# Patient Record
Sex: Male | Born: 1980 | Race: White | Hispanic: No | Marital: Married | Smoking: Never smoker
Health system: Southern US, Community
[De-identification: ages and names within clinical notes are randomized; demographics above are authoritative.]

## PROBLEM LIST (undated history)

## (undated) DIAGNOSIS — R002 Palpitations: Secondary | ICD-10-CM

## (undated) HISTORY — PX: VASECTOMY: SHX75

## (undated) HISTORY — DX: Palpitations: R00.2

---
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
## 2011-11-26 ENCOUNTER — Ambulatory Visit (INDEPENDENT_AMBULATORY_CARE_PROVIDER_SITE_OTHER): Payer: 59 | Admitting: Sports Medicine

## 2011-11-26 VITALS — BP 124/81

## 2011-11-26 DIAGNOSIS — M7630 Iliotibial band syndrome, unspecified leg: Secondary | ICD-10-CM | POA: Insufficient documentation

## 2011-11-26 DIAGNOSIS — M629 Disorder of muscle, unspecified: Secondary | ICD-10-CM

## 2011-11-26 DIAGNOSIS — M25559 Pain in unspecified hip: Secondary | ICD-10-CM

## 2011-11-26 DIAGNOSIS — M25552 Pain in left hip: Secondary | ICD-10-CM | POA: Insufficient documentation

## 2011-11-26 NOTE — Assessment & Plan Note (Addendum)
Visible swelling on [place] consistent with IT band irritation.  No active bursitis at this time I do not think injection is likely to help

## 2011-11-26 NOTE — Assessment & Plan Note (Addendum)
Will continue HEP, but added more exercises.  Cont cross over step, standing flexion and rotation, and abductor strengthening.  Added step up/step down/step across.  Also patient was show stretches for IT band.  Advised running only distances and speeds that do not cause pain, to ice after running, and use ibuprofen as needed.  Follow up in 4-6 weeks.   We need to steadily increase of weight to his exercises  Use a foam roller to rule out the iliotibial band

## 2011-11-26 NOTE — Progress Notes (Signed)
  Subjective:    Patient ID: Jesse Stephenson, male    DOB: 03-Aug-1980, 31 y.o.   MRN: [id number]  HPI  Jesse Stephenson comes in today with hip pain since April.  He is a runner and has increased his mileage over the past year, and for several months had been running about 30 miles a week.  He was in the middle of a run, when about 7 miles in, he had sudden pain and locking up in his left hip.  He saw Dr. Jerl Santos, who felt this was a greater trochanteric bursitis, and gave him hip strengthening exercises and prescribed an NSAID.  He has been doing the exercises now for 5 weeks, but did not take the NSAID.  He says that for the first two weeks, he only did the exercises, but has been trying to run, and his hip starts hurting 3-4 miles into a run.    He has improve somewhat but would like another opinion to see if he can improve faster or if he needs to change any treatment  Review of Systems See HPI    Objective:   Physical Exam BP 124/81 General appearance: alert, cooperative and no distress Hip: FROM in all fields bilaterally, mild discomfort with internal rotation of left leg.  Strength Strength in all fields is in tact, with left hip abduction slightly weaker than the right.  Pelvic alignment unremarkable to inspection and palpation. Standing hip rotation and gait without trendelenburg / unsteadiness. Left Greater trochanter with tenderness to palpation.  MSK [place] Left Hip:  Hip flexors and rotators in tact without abnormality.  Bony landmarks and joint spaces appear normal There is a hypoechoic stripe along the surface to the IT band. There is a small hypoechoic area over greater trochanter suggestive of a small bursa There is some hypoechoic change around the tendon insertion of the hip rotator muscles Increased Doppler activity seen along the iliotibial band at the greater trochanter       Assessment & Plan:

## 2012-01-07 ENCOUNTER — Ambulatory Visit: Payer: 59 | Admitting: Sports Medicine

## 2013-01-28 ENCOUNTER — Encounter: Payer: Self-pay | Admitting: Gastroenterology

## 2013-03-15 ENCOUNTER — Encounter: Payer: Self-pay | Admitting: Gastroenterology

## 2013-03-15 ENCOUNTER — Ambulatory Visit (INDEPENDENT_AMBULATORY_CARE_PROVIDER_SITE_OTHER): Payer: BC Managed Care – PPO | Admitting: Gastroenterology

## 2013-03-15 VITALS — BP 128/72 | HR 60 | Ht 73.0 in | Wt 195.0 lb

## 2013-03-15 DIAGNOSIS — K921 Melena: Secondary | ICD-10-CM

## 2013-03-15 MED ORDER — SOD PICOSULFATE-MAG OX-CIT ACD 10-3.5-12 MG-GM-GM PO PACK: 1.0000 | PACK | ORAL | Status: DC

## 2013-03-15 NOTE — Progress Notes (Signed)
    History of Present Illness: This is a 32 year old male who relates a 6 month history of intermittent bright red blood per rectum with bowel movements, primarily noted on the tissue paper. He states his paternal grandfather developed colon cancer in his 31s. Recent blood work in Dr. Elmyra Ricks office was unremarkable. Denies weight loss, abdominal pain, constipation, diarrhea, change in stool caliber, melena, nausea, vomiting, dysphagia, reflux symptoms, chest pain.  Review of Systems: Pertinent positive and negative review of systems were noted in the above HPI section. All other review of systems were otherwise negative.  Current Medications, Allergies, Past Medical History, Past Surgical History, Family History and Social History were reviewed in [employer] record.  Physical Exam: General: Well developed , well nourished, no acute distress Head: Normocephalic and atraumatic Eyes:  sclerae anicteric, EOMI Ears: Normal auditory acuity Mouth: No deformity or lesions Neck: Supple, no masses or thyromegaly Lungs: Clear throughout to auscultation Heart: Regular rate and rhythm; no murmurs, rubs or bruits Abdomen: Soft, non tender and non distended. No masses, hepatosplenomegaly or hernias noted. Normal Bowel sounds Rectal: Deferred to colonoscopy  Musculoskeletal: Symmetrical with no gross deformities  Skin: No lesions on visible extremities Pulses:  Normal pulses noted Extremities: No clubbing, cyanosis, edema or deformities noted Neurological: Alert oriented x 4, grossly nonfocal Cervical Nodes:  No significant cervical adenopathy Inguinal Nodes: No significant inguinal adenopathy Psychological:  Alert and cooperative. Normal mood and affect  Assessment and Recommendations:  1. Hematochezia. I suspect a benign source of rectal bleeding such as hemorrhoids. Rule out colorectal neoplasms and other disorders. The risks, benefits, and alternatives to colonoscopy with  possible biopsy and possible polypectomy were discussed with the patient and they consent to proceed.

## 2013-03-15 NOTE — Patient Instructions (Signed)
You have been scheduled for a colonoscopy with propofol. Please follow written instructions given to you at your visit today.  Please pick up your prep kit at the pharmacy within the next 1-3 days. If you use inhalers (even only as needed), please bring them with you on the day of your procedure. Your physician has requested that you go to www.startemmi.com and enter the access code given to you at your visit today. This web site gives a general overview about your procedure. However, you should still follow specific instructions given to you by our office regarding your preparation for the procedure.  Thank you for choosing me and [facility].  Venita Lick. Pleas Koch., MD., Clementeen Graham   cc: Pearson Grippe, MD

## 2013-04-07 ENCOUNTER — Ambulatory Visit (AMBULATORY_SURGERY_CENTER): Payer: BC Managed Care – PPO | Admitting: Gastroenterology

## 2013-04-07 ENCOUNTER — Encounter: Payer: Self-pay | Admitting: Gastroenterology

## 2013-04-07 VITALS — BP 109/53 | HR 50 | Temp 96.4°F | Resp 18 | Ht 73.0 in | Wt 195.0 lb

## 2013-04-07 DIAGNOSIS — K648 Other hemorrhoids: Secondary | ICD-10-CM

## 2013-04-07 DIAGNOSIS — K921 Melena: Secondary | ICD-10-CM

## 2013-04-07 MED ORDER — SODIUM CHLORIDE 0.9 % IV SOLN: 500.0000 mL | INTRAVENOUS | Status: DC

## 2013-04-07 NOTE — Patient Instructions (Signed)

## 2013-04-07 NOTE — Op Note (Signed)
[facility] [address] [place], [address]   COLONOSCOPY PROCEDURE REPORT  PATIENT: Jesse Stephenson, Jesse Stephenson  MR#: [id number] BIRTHDATE: [birth year] , 32  yrs. old GENDER: Male ENDOSCOPIST: Meryl Dare, MD, [facility] REFERRED QM:VHQIO Kim, M.D. PROCEDURE DATE:  04/07/2013 PROCEDURE:   Colonoscopy, diagnostic and Hemorrhoidectomy via sclerosing First Screening Colonoscopy - Avg.  risk and is 50 yrs.  old or older - No.  Prior Negative Screening - Now for repeat screening. N/A  History of Adenoma - Now for follow-up colonoscopy & has been > or = to 3 yrs.  N/A  Polyps Removed Today? No.  Recommend repeat exam, <10 yrs? No. ASA CLASS:   Class I INDICATIONS:hematochezia. MEDICATIONS: MAC sedation, administered by CRNA and propofol (Diprivan) 400mg  IV DESCRIPTION OF PROCEDURE:   After the risks benefits and alternatives of the procedure were thoroughly explained, informed consent was obtained.  A digital rectal exam revealed no abnormalities of the rectum.   The LB [id number]  endoscope was introduced through the anus and advanced to the cecum, which was identified by both the appendix and ileocecal valve. No adverse events experienced.   The quality of the prep was [place] excellent  The instrument was then slowly withdrawn as the colon was fully examined.  COLON FINDINGS: A normal appearing cecum, ileocecal valve, and appendiceal orifice were identified.  The ascending, hepatic flexure, transverse, splenic flexure, descending, sigmoid colon and rectum appeared unremarkable.  No polyps or cancers were seen. Retroflexed views revealed moderate internal hemorrhoids. 1 cc total of 23.4% salien was injected into 2 internal hemorrhoids well above the dentate line. The time to cecum=1 minutes 31 seconds. Withdrawal time=10 minutes 06 seconds.  The scope was withdrawn and the procedure completed.  COMPLICATIONS: There were no complications.  ENDOSCOPIC  IMPRESSION: 1.  Normal colon 2.  Moderate internal hemorrhoids  RECOMMENDATIONS: 1. Continue current colorectal screening recommendations for "routine risk" patients with a repeat colonoscopy at age years. 2. GI follow up as needed  eSigned:  Meryl Dare, MD, [facility] 04/07/2013 8:30 AM

## 2013-04-07 NOTE — Progress Notes (Signed)
Called to room to assist during endoscopic procedure.  Patient ID and intended procedure confirmed with present staff. Received instructions for my participation in the procedure from the performing physician.  
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
## 2013-04-08 ENCOUNTER — Telehealth: Payer: Self-pay | Admitting: *Deleted

## 2013-04-08 NOTE — Telephone Encounter (Signed)
Name identifier, left message, follow-up 

## 2016-05-09 ENCOUNTER — Ambulatory Visit: Payer: Self-pay | Admitting: Gastroenterology

## 2016-05-09 ENCOUNTER — Telehealth: Payer: Self-pay

## 2016-05-09 NOTE — Telephone Encounter (Signed)
No show letter mailed to patient and called and [facility] that he no showed.

## 2017-05-10 ENCOUNTER — Other Ambulatory Visit: Payer: Self-pay

## 2017-05-10 ENCOUNTER — Ambulatory Visit (HOSPITAL_COMMUNITY)
Admission: EM | Admit: 2017-05-10 | Discharge: 2017-05-10 | Disposition: A | Discharge status: Home / Self Care | Payer: Self-pay | Attending: Family Medicine | Admitting: Family Medicine

## 2017-05-10 ENCOUNTER — Ambulatory Visit (INDEPENDENT_AMBULATORY_CARE_PROVIDER_SITE_OTHER): Payer: Self-pay

## 2017-05-10 ENCOUNTER — Encounter (HOSPITAL_COMMUNITY): Payer: Self-pay

## 2017-05-10 DIAGNOSIS — S93422A Sprain of deltoid ligament of left ankle, initial encounter: Secondary | ICD-10-CM

## 2017-05-10 DIAGNOSIS — M25572 Pain in left ankle and joints of left foot: Secondary | ICD-10-CM
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
## 2017-05-10 NOTE — Discharge Instructions (Signed)
No fracture by xray. Treat symptomatically with ice, rest and monitor. If not improving or worsens the f/u with Orthopedics. Take care.
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
